# Patient Record
Sex: Male | Born: 1995 | Race: White | Hispanic: Yes | Marital: Single | Smoking: Never smoker
Health system: Southern US, Community
[De-identification: ages and names within clinical notes are randomized; demographics above are authoritative.]

---
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
## 2021-09-24 ENCOUNTER — Ambulatory Visit: Payer: Self-pay | Admitting: Physician Assistant

## 2021-09-24 ENCOUNTER — Encounter: Payer: Self-pay | Admitting: Physician Assistant

## 2021-09-24 ENCOUNTER — Other Ambulatory Visit: Payer: Self-pay

## 2021-09-24 DIAGNOSIS — Z113 Encounter for screening for infections with a predominantly sexual mode of transmission: Secondary | ICD-10-CM

## 2021-09-24 DIAGNOSIS — Z299 Encounter for prophylactic measures, unspecified: Secondary | ICD-10-CM

## 2021-09-24 LAB — GRAM STAIN

## 2021-09-24 LAB — HM HIV SCREENING LAB: HM HIV Screening: NEGATIVE

## 2021-09-24 MED ORDER — ACYCLOVIR 400 MG PO TABS: 400.0000 mg | ORAL_TABLET | Freq: Three times a day (TID) | ORAL | 0 refills | Status: AC

## 2021-09-24 MED ORDER — DOXYCYCLINE HYCLATE 100 MG PO TABS: 100.0000 mg | ORAL_TABLET | Freq: Two times a day (BID) | ORAL | 0 refills | Status: AC

## 2021-09-24 NOTE — Progress Notes (Signed)
[facility] Department STI clinic/screening visit  Subjective:  Don Atkinson is a 25 y.o. male being seen today for an STI screening visit. The patient reports they do have symptoms.    Patient has the following medical conditions:  There are no problems to display for this patient.    Chief Complaint  Patient presents with   SEXUALLY TRANSMITTED DISEASE    screening    HPI  Patient reports that he has had swollen nodes since yesterday and "blisters" for 3 days on the shaft of his penis.  Denies chronic conditions, surgeries and regular medicines.  Reports last HIV test was never and last void prior to sample collection for Gram stain was about 1 hr ago.   Screening for MPX risk: Does the patient have an unexplained rash? No Is the patient MSM? No Does the patient endorse multiple sex partners or anonymous sex partners? No Did the patient have close or sexual contact with a person diagnosed with MPX? No Has the patient traveled outside the [place] where MPX is endemic? No Is there a high clinical suspicion for MPX-- evidenced by one of the following Yes  -Unlikely to be chickenpox  -Lymphadenopathy  -Rash that present in same phase of evolution on any given body part   See flowsheet for further details and programmatic requirements.    The following portions of the patient's history were reviewed and updated as appropriate: allergies, current medications, past medical history, past social history, past surgical history and problem list.  Objective:  There were no vitals filed for this visit.  Physical Exam Constitutional:      General: He is not in acute distress.    Appearance: Normal appearance.  HENT:     Head: Normocephalic and atraumatic.     Comments: No nits,lice, or hair loss. No cervical, supraclavicular or axillary adenopathy.     Mouth/Throat:     Mouth: Mucous membranes are moist.     Pharynx: Oropharynx is clear. No oropharyngeal exudate or  posterior oropharyngeal erythema.  Eyes:     Conjunctiva/sclera: Conjunctivae normal.  Pulmonary:     Effort: Pulmonary effort is normal.  Abdominal:     Palpations: Abdomen is soft. There is no mass.     Tenderness: There is no abdominal tenderness. There is no guarding or rebound.  Genitourinary:    Penis: Normal.      Testes: Normal.     Comments: Pubic area without nits, lice, hair loss, edema, erythema, or lesions. Bilateral inguinal adenopathy with right side >left side. Penis circumcised without rash and discharge at meatus. Penile shaft just below corona with cluster of 3 ~32mm vesicles on right side.  Tender to culture for HSV and MPX. Testicles descended bilaterally,nt, no masses or edema.  Musculoskeletal:     Cervical back: Neck supple. No tenderness.  Skin:    General: Skin is warm and dry.     Findings: No bruising, erythema, lesion or rash.  Neurological:     Mental Status: He is alert and oriented to person, place, and time.  Psychiatric:        Mood and Affect: Mood normal.        Behavior: Behavior normal.        Thought Content: Thought content normal.        Judgment: Judgment normal.      Assessment and Plan:  Don Atkinson is a 26 y.o. male presenting to the [facility] Department for STI  screening  1. Screening for STD (sexually transmitted disease) Patient into clinic with symptoms. Reviewed with patient Gram stain results.  Rec condoms with all sex. Await test results.  Counseled that RN will call if needs to RTC for treatment once results are back.  - Gram stain - Gonococcus culture - HIV [facility] - Syphilis Serology, [facility] - Virology, [facility] - Virology, [facility]  2. Prophylactic measure Will cover for possible HSV with Acyclovir 400 mg #30 1 po TID for 10 days while awaiting results. Will also cover for possible LGV/Chlamydia/Mycoplasma and Ureaplasma with Doxycycline 100 mg #28 1 po BID for 14 days. No sex  for 14 days and until after partner completes treatment. Call with questions or concerns.  - acyclovir (ZOVIRAX) 400 MG tablet; Take 1 tablet (400 mg total) by mouth 3 (three) times daily.  Dispense: 30 tablet; Refill: 0 - doxycycline (VIBRA-TABS) 100 MG tablet; Take 1 tablet (100 mg total) by mouth 2 (two) times daily for 7 days.  Dispense: 14 tablet; Refill: 0     No follow-ups on file.  No future appointments.  Matt Holmes, PA

## 2021-09-26 NOTE — Progress Notes (Signed)
Chart reviewed by Pharmacist  Suzanne Walker PharmD, Contract Pharmacist at [facility]  

## 2021-09-28 LAB — GONOCOCCUS CULTURE

## 2021-10-01 ENCOUNTER — Other Ambulatory Visit: Payer: Self-pay

## 2021-10-01 ENCOUNTER — Ambulatory Visit: Payer: Self-pay | Admitting: Family Medicine

## 2021-10-01 DIAGNOSIS — A609 Anogenital herpesviral infection, unspecified: Secondary | ICD-10-CM

## 2021-10-01 NOTE — Progress Notes (Signed)
Pt here to discuss treatment options for HSV.  Pt does not want any additional testing.  Pt would like to have suppressive treatment and he was notified that his RX would be sent to the Pharmacy of his choice.  Pt notified to call with any questions or concerns.  Berdie Ogren, RN

## 2021-10-04 ENCOUNTER — Other Ambulatory Visit: Payer: Self-pay | Admitting: Physician Assistant

## 2021-10-04 DIAGNOSIS — B009 Herpesviral infection, unspecified: Secondary | ICD-10-CM | POA: Insufficient documentation

## 2021-10-04 MED ORDER — ACYCLOVIR 800 MG PO TABS: 800.0000 mg | ORAL_TABLET | Freq: Every day | ORAL | 11 refills | Status: AC

## 2021-10-04 NOTE — Progress Notes (Signed)
Patient discussed test results with RN on 10/01/21.  Patient requested suppressive treatment be sent to pharmacy of choice.  Rx sent for Acyclovir 800 mg #30 1 po daily with refills for 1 year.

## 2021-10-11 NOTE — Progress Notes (Signed)
Attestation of Attending Supervision of clinical support staff: I agree with the care provided to this patient and was available for any consultation.  I have reviewed the RN's note and chart.  I was off site on the date of service and all providers that would typically provide services called off. I was consulted to assure appropriate continuation of services for [facility] clients.   Suppressive therapy for HSV was sent on 10/31 by PA Carlyn Reichert, MD, MPH, [facility] Medical Director

## 2022-04-16 ENCOUNTER — Other Ambulatory Visit: Payer: Self-pay | Admitting: Physician Assistant

## 2022-04-16 DIAGNOSIS — B009 Herpesviral infection, unspecified: Secondary | ICD-10-CM

## 2022-08-25 ENCOUNTER — Ambulatory Visit
Admission: EM | Admit: 2022-08-25 | Discharge: 2022-08-25 | Disposition: A | Discharge status: Home / Self Care | Payer: BC Managed Care – PPO | Attending: Urgent Care | Admitting: Urgent Care

## 2022-08-25 ENCOUNTER — Encounter: Payer: Self-pay | Admitting: Emergency Medicine

## 2022-08-25 DIAGNOSIS — R3121 Asymptomatic microscopic hematuria: Secondary | ICD-10-CM | POA: Diagnosis not present

## 2022-08-25 DIAGNOSIS — S29012A Strain of muscle and tendon of back wall of thorax, initial encounter: Secondary | ICD-10-CM | POA: Diagnosis not present

## 2022-08-25 DIAGNOSIS — R109 Unspecified abdominal pain: Secondary | ICD-10-CM | POA: Diagnosis not present

## 2022-08-25 LAB — POCT URINALYSIS DIP (MANUAL ENTRY)
Bilirubin, UA: NEGATIVE
Blood, UA: NEGATIVE
Glucose, UA: NEGATIVE mg/dL
Ketones, POC UA: NEGATIVE mg/dL
Leukocytes, UA: NEGATIVE
Nitrite, UA: NEGATIVE
Protein Ur, POC: NEGATIVE mg/dL
Spec Grav, UA: >=1.03 — AB (ref 1.010–1.025)
Urobilinogen, UA: 0.2 E.U./dL
pH, UA: 5.5 (ref 5.0–8.0)

## 2022-08-25 NOTE — ED Provider Notes (Signed)
Roderic Palau    CSN: [id number] Arrival date & time: 08/25/22  1303      History   Chief Complaint Chief Complaint  Patient presents with   Back Pain    HPI Don Atkinson is a 26 y.o. male.    Back Pain   Presents to urgent care with report of right sided upper back pain below his shoulder blade x1 week.  States that symptoms started after working out at [employer] though he does not recall injuring himself and does not believe there is any connection.  He denies any increased pain with movement.  Pain is relieved with massage.  Also complains of right sided flank pain that started 2-day.  Possibly associated symptom is an episode of hematuria last night.  Patient states he saw a "tinge of blood" that looked brown in the bowl.  Denies any previous or recurrent episodes of hematuria. Denies dysuria, abdominal pain, fever.  History reviewed. No pertinent past medical history.  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 No pertinent surgical history.     Home Medications    Prior to Admission medications   Medication Sig Start Date End Date Taking? Authorizing Provider  acyclovir (ZOVIRAX) 400 MG tablet Take 1 tablet (400 mg total) by mouth 3 (three) times daily. 09/24/21   Jerene Dilling, PA  acyclovir (ZOVIRAX) 800 MG tablet Take 1 tablet (800 mg total) by mouth daily. 10/04/21   Jerene Dilling, PA    Family History Family History  Problem Relation Age of Onset   Diabetes Mother    Hypertension Father    Kidney Stones Father     Social History Social History   Tobacco Use   Smoking status: Never   Smokeless tobacco: Never  Substance Use Topics   Alcohol use: Yes    Comment: ocassional   Drug use: Not Currently    Types: Marijuana     Allergies   Patient has no known allergies.   Review of Systems Review of Systems  Musculoskeletal:  Positive for back pain.     Physical  Exam Triage Vital Signs ED Triage Vitals  Enc Vitals Group     BP 08/25/22 1319 (!) 144/80     Pulse Rate 08/25/22 1319 (!) 50     Resp 08/25/22 1319 16     Temp 08/25/22 1319 98.8 F (37.1 C)     Temp Source 08/25/22 1319 Oral     SpO2 08/25/22 1319 97 %     Weight 08/25/22 1325 185 lb (83.9 kg)     Height 08/25/22 1325 6' (1.829 m)     Head Circumference --      Peak Flow --      Pain Score 08/25/22 1325 6     Pain Loc --      Pain Edu? --      Excl. in [place]? --    No data found.  Updated Vital Signs BP (!) 144/80 (BP Location: Left Arm)   Pulse (!) 50   Temp 98.8 F (37.1 C) (Oral)   Resp 16   Ht 6' (1.829 m)   Wt 185 lb (83.9 kg)   SpO2 97%   BMI 25.09 kg/m   Visual Acuity Right Eye Distance:   Left Eye Distance:   Bilateral Distance:    Right Eye Near:   Left Eye Near:    Bilateral Near:  Physical Exam Vitals reviewed.  Constitutional:      Appearance: Normal appearance.  Abdominal:     Tenderness: There is no right CVA tenderness or left CVA tenderness.  Musculoskeletal:     Comments: "Back pain" can be induced by resisted anterior extension of his right arm.  Also by right sided twisting motion.  Also with deep breath.  Neurological:     Mental Status: He is alert.      UC Treatments / Results  Labs (all labs ordered are listed, but only abnormal results are displayed) Labs Reviewed  POCT URINALYSIS DIP (MANUAL ENTRY)    EKG   Radiology No results found.  Procedures Procedures (including critical care time)  Medications Ordered in UC Medications - No data to display  Initial Impression / Assessment and Plan / UC Course  I have reviewed the triage vital signs and the nursing notes.  Pertinent labs & imaging results that were available during my care of the patient were reviewed by me and considered in my medical decision making (see chart for details).   UA is negative for blood.  Unclear etiology of his hematuria.  Agreed to  watch and wait.  Unclear if there is a connection between the reported flank pain and hematuria.  No flank pain is induced today.  Suspect muscle strain due to weight bearing exercise at the gym.  Recommended use of ibuprofen for pain relief.  Will provide resource for orthopedic urgent care if symptoms continue or worsen.   Final Clinical Impressions(s) / UC Diagnoses   Final diagnoses:  None   Discharge Instructions   None    ED Prescriptions   None    PDMP not reviewed this encounter.   Rose Phi, [place] 08/25/22 1357

## 2022-08-25 NOTE — Discharge Instructions (Addendum)
Schedule a follow-up appointment with the [facility] orthopedic clinic if your back pain symptoms do not resolve or worsen.

## 2022-08-25 NOTE — ED Triage Notes (Signed)
Patient c/o RT sided upper back pain below shoulder blade x 1 week.   Patient c/o Rt sided flank pain x 1 day.   Patient denies dysuria, ABD pain,  or fever.  Patient denies any increased pain with movement.    Patient endorses onset of symptoms began after working out a the gym.   Patient endorses decreased pain when palpating area.   Patient endorses 1 episode of hematuria last night.   Patient hasn't taken any medications for symptoms.

## 2023-11-15 ENCOUNTER — Other Ambulatory Visit: Payer: Self-pay | Admitting: Internal Medicine

## 2023-11-15 DIAGNOSIS — R079 Chest pain, unspecified: Secondary | ICD-10-CM

## 2023-11-15 DIAGNOSIS — I10 Essential (primary) hypertension: Secondary | ICD-10-CM

## 2023-11-16 ENCOUNTER — Encounter (HOSPITAL_COMMUNITY): Payer: Self-pay

## 2023-11-16 ENCOUNTER — Other Ambulatory Visit: Payer: Self-pay | Admitting: Internal Medicine

## 2023-11-16 DIAGNOSIS — I10 Essential (primary) hypertension: Secondary | ICD-10-CM

## 2023-11-16 DIAGNOSIS — R079 Chest pain, unspecified: Secondary | ICD-10-CM

## 2023-11-17 ENCOUNTER — Telehealth (HOSPITAL_COMMUNITY): Payer: Self-pay | Admitting: Emergency Medicine

## 2023-11-17 NOTE — Telephone Encounter (Signed)
Reaching out to patient to offer assistance regarding upcoming cardiac imaging study; pt verbalizes understanding of appt date/time, parking situation and where to check in, pre-test NPO status and medications ordered, and verified current allergies; name and call back number provided for further questions should they arise Cayne Yom RN Navigator Cardiac Imaging [facility] [phone] office [phone] cell 

## 2023-11-20 ENCOUNTER — Ambulatory Visit
Admission: RE | Admit: 2023-11-20 | Discharge: 2023-11-20 | Disposition: A | Discharge status: Home / Self Care | Payer: BC Managed Care – PPO | Source: Ambulatory Visit | Attending: Internal Medicine | Admitting: Internal Medicine

## 2023-11-20 ENCOUNTER — Ambulatory Visit: Payer: BC Managed Care – PPO

## 2023-11-20 DIAGNOSIS — I10 Essential (primary) hypertension: Secondary | ICD-10-CM | POA: Insufficient documentation

## 2023-11-20 DIAGNOSIS — R079 Chest pain, unspecified: Secondary | ICD-10-CM | POA: Insufficient documentation

## 2023-11-20 MED ORDER — IOHEXOL 350 MG/ML SOLN
80.0000 mL | Freq: Once | INTRAVENOUS | Status: AC | PRN
  Administered 2023-11-20: 80 mL via INTRAVENOUS

## 2023-11-20 MED ORDER — DILTIAZEM HCL 25 MG/5ML IV SOLN: 10.0000 mg | INTRAVENOUS | Status: DC | PRN

## 2023-11-20 MED ORDER — NITROGLYCERIN 0.4 MG SL SUBL
0.8000 mg | SUBLINGUAL_TABLET | Freq: Once | SUBLINGUAL | Status: AC
Start: 2023-11-20 — End: 2023-11-20
  Administered 2023-11-20: 0.8 mg via SUBLINGUAL
  Filled 2023-11-20: qty 25

## 2023-11-20 MED ORDER — METOPROLOL TARTRATE 5 MG/5ML IV SOLN: 10.0000 mg | INTRAVENOUS | Status: DC | PRN

## 2023-11-20 NOTE — Progress Notes (Signed)
Patient tolerated procedure well. Ambulate w/o difficulty. Denies any lightheadedness or being dizzy. Pt denies any pain at this time. Sitting in chair. Pt is encouraged to drink additional water throughout the day and reason explained to patient. Patient verbalized understanding and all questions answered. ABC intact. No further needs at this time. Discharge from procedure area w/o issues. 

## 2023-11-21 ENCOUNTER — Ambulatory Visit: Payer: BC Managed Care – PPO | Attending: Internal Medicine
